# Patient Record
Sex: Male | Born: 1975 | Race: White | Hispanic: No | Marital: Married | Smoking: Never smoker
Health system: Southern US, Community
[De-identification: ages and names within clinical notes are randomized; demographics above are authoritative.]

## PROBLEM LIST (undated history)

## (undated) DIAGNOSIS — J449 Chronic obstructive pulmonary disease, unspecified: Secondary | ICD-10-CM

## (undated) HISTORY — PX: TONSILLECTOMY: SUR1361

---

## 2013-10-05 ENCOUNTER — Emergency Department (HOSPITAL_BASED_OUTPATIENT_CLINIC_OR_DEPARTMENT_OTHER): Payer: Self-pay

## 2013-10-05 ENCOUNTER — Emergency Department (HOSPITAL_BASED_OUTPATIENT_CLINIC_OR_DEPARTMENT_OTHER)
Admission: EM | Admit: 2013-10-05 | Discharge: 2013-10-06 | Disposition: A | Discharge status: Home / Self Care | Payer: Self-pay | Attending: Emergency Medicine | Admitting: Emergency Medicine

## 2013-10-05 ENCOUNTER — Encounter (HOSPITAL_BASED_OUTPATIENT_CLINIC_OR_DEPARTMENT_OTHER): Payer: Self-pay | Admitting: Emergency Medicine

## 2013-10-05 DIAGNOSIS — S2232XA Fracture of one rib, left side, initial encounter for closed fracture: Secondary | ICD-10-CM

## 2013-10-05 DIAGNOSIS — Y9389 Activity, other specified: Secondary | ICD-10-CM | POA: Insufficient documentation

## 2013-10-05 DIAGNOSIS — S2239XA Fracture of one rib, unspecified side, initial encounter for closed fracture: Secondary | ICD-10-CM | POA: Insufficient documentation

## 2013-10-05 DIAGNOSIS — J45901 Unspecified asthma with (acute) exacerbation: Secondary | ICD-10-CM | POA: Insufficient documentation

## 2013-10-05 DIAGNOSIS — F17200 Nicotine dependence, unspecified, uncomplicated: Secondary | ICD-10-CM | POA: Insufficient documentation

## 2013-10-05 DIAGNOSIS — IMO0002 Reserved for concepts with insufficient information to code with codable children: Secondary | ICD-10-CM | POA: Insufficient documentation

## 2013-10-05 DIAGNOSIS — Y92410 Unspecified street and highway as the place of occurrence of the external cause: Secondary | ICD-10-CM | POA: Insufficient documentation

## 2013-10-05 MED ORDER — IBUPROFEN 800 MG PO TABS
800.0000 mg | ORAL_TABLET | Freq: Once | ORAL | Status: AC
Start: 2013-10-05 — End: 2013-10-05
  Administered 2013-10-05: 800 mg via ORAL
  Filled 2013-10-05: qty 1

## 2013-10-05 MED ORDER — ALBUTEROL SULFATE (2.5 MG/3ML) 0.083% IN NEBU
5.0000 mg | INHALATION_SOLUTION | Freq: Once | RESPIRATORY_TRACT | Status: AC
  Administered 2013-10-05: 5 mg via RESPIRATORY_TRACT
  Filled 2013-10-05: qty 6

## 2013-10-05 NOTE — ED Notes (Signed)
C/o left rib and back pain onset 2 weeks ago   Pain increased w cough   Felt increased in pressure w cough tonight
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
## 2013-10-06 MED ORDER — ALBUTEROL SULFATE HFA 108 (90 BASE) MCG/ACT IN AERS: 1.0000 | INHALATION_SPRAY | Freq: Four times a day (QID) | RESPIRATORY_TRACT | Status: AC | PRN

## 2013-10-06 MED ORDER — HYDROCODONE-ACETAMINOPHEN 7.5-325 MG/15ML PO SOLN: 10.0000 mL | Freq: Four times a day (QID) | ORAL | Status: AC | PRN

## 2013-10-06 MED ORDER — GUAIFENESIN ER 600 MG PO TB12: 600.0000 mg | ORAL_TABLET | Freq: Two times a day (BID) | ORAL | Status: AC

## 2013-12-16 ENCOUNTER — Emergency Department (HOSPITAL_BASED_OUTPATIENT_CLINIC_OR_DEPARTMENT_OTHER)
Admission: EM | Admit: 2013-12-16 | Discharge: 2013-12-17 | Discharge status: Against Medical Advice | Payer: Self-pay | Attending: Emergency Medicine | Admitting: Emergency Medicine

## 2013-12-16 ENCOUNTER — Encounter (HOSPITAL_BASED_OUTPATIENT_CLINIC_OR_DEPARTMENT_OTHER): Payer: Self-pay | Admitting: Emergency Medicine

## 2013-12-16 DIAGNOSIS — H9319 Tinnitus, unspecified ear: Secondary | ICD-10-CM | POA: Insufficient documentation

## 2013-12-16 DIAGNOSIS — E119 Type 2 diabetes mellitus without complications: Secondary | ICD-10-CM | POA: Insufficient documentation

## 2013-12-16 DIAGNOSIS — Z79899 Other long term (current) drug therapy: Secondary | ICD-10-CM | POA: Insufficient documentation

## 2013-12-16 DIAGNOSIS — R739 Hyperglycemia, unspecified: Secondary | ICD-10-CM

## 2013-12-16 DIAGNOSIS — Z9089 Acquired absence of other organs: Secondary | ICD-10-CM | POA: Insufficient documentation

## 2013-12-16 DIAGNOSIS — J441 Chronic obstructive pulmonary disease with (acute) exacerbation: Secondary | ICD-10-CM | POA: Insufficient documentation

## 2013-12-16 DIAGNOSIS — R079 Chest pain, unspecified: Secondary | ICD-10-CM | POA: Insufficient documentation

## 2013-12-16 HISTORY — DX: Chronic obstructive pulmonary disease, unspecified: J44.9

## 2013-12-16 LAB — URINALYSIS, ROUTINE W REFLEX MICROSCOPIC
Bilirubin Urine: NEGATIVE
Glucose, UA: >1000 mg/dL — AB
HGB URINE DIPSTICK: NEGATIVE
Ketones, ur: 40 mg/dL — AB
Leukocytes, UA: NEGATIVE
Nitrite: NEGATIVE
PROTEIN: NEGATIVE mg/dL
Specific Gravity, Urine: 1.041 — ABNORMAL HIGH (ref 1.005–1.030)
UROBILINOGEN UA: 0.2 mg/dL (ref 0.0–1.0)
pH: 5 (ref 5.0–8.0)

## 2013-12-16 LAB — CBC
HEMATOCRIT: 46.2 % (ref 39.0–52.0)
Hemoglobin: 16.8 g/dL (ref 13.0–17.0)
MCH: 33.6 pg (ref 26.0–34.0)
MCHC: 36.4 g/dL — ABNORMAL HIGH (ref 30.0–36.0)
MCV: 92.4 fL (ref 78.0–100.0)
PLATELETS: 207 10*3/uL (ref 150–400)
RBC: 5 MIL/uL (ref 4.22–5.81)
RDW: 12.2 % (ref 11.5–15.5)
WBC: 9.7 10*3/uL (ref 4.0–10.5)

## 2013-12-16 LAB — COMPREHENSIVE METABOLIC PANEL
ALT: 17 U/L (ref 0–53)
AST: 8 U/L (ref 0–37)
Albumin: 3.8 g/dL (ref 3.5–5.2)
Alkaline Phosphatase: 130 U/L — ABNORMAL HIGH (ref 39–117)
BILIRUBIN TOTAL: 0.3 mg/dL (ref 0.3–1.2)
BUN: 12 mg/dL (ref 6–23)
CHLORIDE: 92 meq/L — AB (ref 96–112)
CO2: 20 meq/L (ref 19–32)
CREATININE: 0.7 mg/dL (ref 0.50–1.35)
Calcium: 9.4 mg/dL (ref 8.4–10.5)
GFR calc Af Amer: >90 mL/min (ref >90)
GFR calc non Af Amer: >90 mL/min (ref >90)
GLUCOSE: 515 mg/dL — AB (ref 70–99)
Potassium: 4.1 mEq/L (ref 3.7–5.3)
Sodium: 131 mEq/L — ABNORMAL LOW (ref 137–147)
Total Protein: 7.2 g/dL (ref 6.0–8.3)

## 2013-12-16 LAB — CBG MONITORING, ED: Glucose-Capillary: 504 mg/dL — ABNORMAL HIGH (ref 70–99)

## 2013-12-16 LAB — URINE MICROSCOPIC-ADD ON

## 2013-12-16 MED ORDER — SODIUM CHLORIDE 0.9 % IV SOLN
1000.0000 mL | Freq: Once | INTRAVENOUS | Status: AC
  Administered 2013-12-16: 1000 mL via INTRAVENOUS

## 2013-12-16 MED ORDER — SODIUM CHLORIDE 0.9 % IV SOLN: 1000.0000 mL | INTRAVENOUS | Status: DC

## 2013-12-16 NOTE — ED Notes (Signed)
Pt states for the past few days he has been experiencing HA, generalized not feeling well with tired and today the blurred vision was worse so he got worried and had a friend check his blood sugar. Pt states he tried 3 times with the machine and all three times read High seek medical attention. Pt states that this is the first time he has had problems with his blood sugar but he has not been to the doctor in a while to have any checked. Pt states that he is still slighty blurred currently. Pt denies nausea.

## 2013-12-16 NOTE — ED Notes (Signed)
C/o blurred vision, ringing in ears x 2-3 days-tested BS on friend's glucometer over 600 x 3 readings

## 2013-12-16 NOTE — ED Provider Notes (Signed)
CSN: [id number]     Arrival date & time 12/16/13  2038 History  This chart was scribed for Shelda Jakes, MD by Blanchard Kelch, ED Scribe. The patient was seen in room MH06/MH06. Patient's care was started at 11:34 PM.    Chief Complaint  Patient presents with  . Blurred Vision     Patient is a 38 y.o. male presenting with hyperglycemia. The history is provided by the patient. No language interpreter was used.  Hyperglycemia Blood sugar level PTA:  >600 Duration:  4 days Timing:  Constant Progression:  Unchanged Chronicity:  New Diabetes status:  Non-diabetic Current diabetic therapy:  None Ineffective treatments:  None tried Associated symptoms: chest pain, dizziness, increased thirst, nausea, polyuria and shortness of breath   Associated symptoms: no abdominal pain, no confusion, no dysuria, no fever and no vomiting     HPI Comments: Jaime Cohen is a 38 y.o. male who presents to the Emergency Department complaining of an episode of symptomatic hyperglycemia for about four days. Associated symptoms include blurred vision, tinnitus, lightheadedness, dizziness and nausea. He states that he's had polyuria, polydipsia and headaches for about a year but has not checked his BS levels until today. His reading today was greater than 600 on three separate readings but this is the first he has checked his BS since the polyuria and polydipsia began. He denies a prior diagnosis of diabetes. He also reports recent cough and shortness of breath. He was seen for this in the ED and diagnosed with COPD and prescribed an inhaler. He reports intermittent chest pain but denies any currently. He denies fever, abdominal pain, diarrhea, dysuria, leg swelling, rash, history of bleeding easily, neck pain, back pain   Pt denies having a PCP currently.     Past Medical History  Diagnosis Date  . COPD (chronic obstructive pulmonary disease)    Past Surgical History  Procedure Laterality Date  .  Tonsillectomy     No family history on file. History  Substance Use Topics  . Smoking status: Never Smoker   . Smokeless tobacco: Not on file  . Alcohol Use: No    Review of Systems  Constitutional: Negative for fever and chills.  HENT: Positive for tinnitus. Negative for rhinorrhea.   Eyes: Positive for visual disturbance.  Respiratory: Positive for cough and shortness of breath.   Cardiovascular: Positive for chest pain. Negative for leg swelling.  Gastrointestinal: Positive for nausea. Negative for vomiting, abdominal pain and diarrhea.  Endocrine: Positive for polydipsia and polyuria.  Genitourinary: Negative for dysuria.  Musculoskeletal: Negative for back pain.  Skin: Negative for rash.  Neurological: Positive for dizziness, light-headedness and headaches.  Hematological: Does not bruise/bleed easily.  Psychiatric/Behavioral: Negative for confusion.      Allergies  Review of patient's allergies indicates no known allergies.  Home Medications   Current Outpatient Rx  Name  Route  Sig  Dispense  Refill  . albuterol (PROVENTIL HFA;VENTOLIN HFA) 108 (90 BASE) MCG/ACT inhaler   Inhalation   Inhale 1-2 puffs into the lungs every 6 (six) hours as needed for wheezing or shortness of breath.   1 Inhaler   0   . guaiFENesin (MUCINEX) 600 MG 12 hr tablet   Oral   Take 1 tablet (600 mg total) by mouth 2 (two) times daily.   20 tablet   0   . HYDROcodone-acetaminophen (HYCET) 7.5-325 mg/15 ml solution   Oral   Take 10 mLs by mouth every 6 (six) hours as needed  for moderate pain.   60 mL   0   . metFORMIN (GLUCOPHAGE) 500 MG tablet   Oral   Take 1 tablet (500 mg total) by mouth 2 (two) times daily with a meal.   60 tablet   2    Triage Vitals: BP 130/85  Pulse 92  Temp(Src) 98.1 F (36.7 C) (Oral)  Resp 18  Ht 5\' 8"  (1.727 m)  Wt 238 lb (107.956 kg)  BMI 36.20 kg/m2  SpO2 98%  Physical Exam  Nursing note and vitals reviewed. Constitutional: He is  oriented to person, place, and time. He appears well-developed and well-nourished. No distress.  HENT:  Mouth/Throat: Mucous membranes are normal.  Eyes: EOM are normal.  Cardiovascular: Normal rate and regular rhythm.   Pulmonary/Chest: Effort normal and breath sounds normal. No respiratory distress. He has no wheezes. He has no rales.  Abdominal: Bowel sounds are normal. There is no tenderness.  Musculoskeletal: He exhibits no edema.  Neurological: He is alert and oriented to person, place, and time. No cranial nerve deficit.  Skin: Skin is warm and dry.  Psychiatric: He has a normal mood and affect.    ED Course  Procedures (including critical care time)  DIAGNOSTIC STUDIES: Oxygen Saturation is 98% on room air, normal by my interpretation.    COORDINATION OF CARE: 11:34 PM -Will order IV fluids with plan to admit patient if BS remain very elevated. Patient verbalizes understanding and agrees with treatment plan.   Results for orders placed during the hospital encounter of 12/16/13  CBC      Result Value Ref Range   WBC 9.7  4.0 - 10.5 K/uL   RBC 5.00  4.22 - 5.81 MIL/uL   Hemoglobin 16.8  13.0 - 17.0 g/dL   HCT 16.146.2  09.639.0 - 04.552.0 %   MCV 92.4  78.0 - 100.0 fL   MCH 33.6  26.0 - 34.0 pg   MCHC 36.4 (*) 30.0 - 36.0 g/dL   RDW 40.912.2  81.111.5 - 91.415.5 %   Platelets 207  150 - 400 K/uL  COMPREHENSIVE METABOLIC PANEL      Result Value Ref Range   Sodium 131 (*) 137 - 147 mEq/L   Potassium 4.1  3.7 - 5.3 mEq/L   Chloride 92 (*) 96 - 112 mEq/L   CO2 20  19 - 32 mEq/L   Glucose, Bld 515 (*) 70 - 99 mg/dL   BUN 12  6 - 23 mg/dL   Creatinine, Ser 7.820.70  0.50 - 1.35 mg/dL   Calcium 9.4  8.4 - 95.610.5 mg/dL   Total Protein 7.2  6.0 - 8.3 g/dL   Albumin 3.8  3.5 - 5.2 g/dL   AST 8  0 - 37 U/L   ALT 17  0 - 53 U/L   Alkaline Phosphatase 130 (*) 39 - 117 U/L   Total Bilirubin 0.3  0.3 - 1.2 mg/dL   GFR calc non Af Amer >90  >90 mL/min   GFR calc Af Amer >90  >90 mL/min  URINALYSIS,  ROUTINE W REFLEX MICROSCOPIC      Result Value Ref Range   Color, Urine YELLOW  YELLOW   APPearance CLEAR  CLEAR   Specific Gravity, Urine 1.041 (*) 1.005 - 1.030   pH 5.0  5.0 - 8.0   Glucose, UA >1000 (*) NEGATIVE mg/dL   Hgb urine dipstick NEGATIVE  NEGATIVE   Bilirubin Urine NEGATIVE  NEGATIVE   Ketones, ur 40 (*) NEGATIVE mg/dL  Protein, ur NEGATIVE  NEGATIVE mg/dL   Urobilinogen, UA 0.2  0.0 - 1.0 mg/dL   Nitrite NEGATIVE  NEGATIVE   Leukocytes, UA NEGATIVE  NEGATIVE  URINE MICROSCOPIC-ADD ON      Result Value Ref Range   Squamous Epithelial / LPF RARE  RARE   Bacteria, UA RARE  RARE  BASIC METABOLIC PANEL      Result Value Ref Range   Sodium 136 (*) 137 - 147 mEq/L   Potassium 3.5 (*) 3.7 - 5.3 mEq/L   Chloride 99  96 - 112 mEq/L   CO2 17 (*) 19 - 32 mEq/L   Glucose, Bld 265 (*) 70 - 99 mg/dL   BUN 9  6 - 23 mg/dL   Creatinine, Ser 1.61  0.50 - 1.35 mg/dL   Calcium 8.4  8.4 - 09.6 mg/dL   GFR calc non Af Amer >90  >90 mL/min   GFR calc Af Amer >90  >90 mL/min  CBG MONITORING, ED      Result Value Ref Range   Glucose-Capillary 504 (*) 70 - 99 mg/dL   Comment 1 Notify RN     Comment 2 Documented in Chart    CBG MONITORING, ED      Result Value Ref Range   Glucose-Capillary 376 (*) 70 - 99 mg/dL  CBG MONITORING, ED      Result Value Ref Range   Glucose-Capillary 224 (*) 70 - 99 mg/dL     Imaging Review Dg Chest 2 View  12/17/2013   CLINICAL DATA:  Four-day history of hyperglycemia, blurred vision, tinnitus, dizziness, and nausea.  EXAM: CHEST  2 VIEW  COMPARISON:  One view chest x-ray 10/05/2013.  FINDINGS: Cardiomediastinal silhouette unremarkable and unchanged. Lungs clear. Bronchovascular markings normal. Pulmonary vascularity normal. No visible pleural effusions. No pneumothorax. Mild degenerative changes involving the lower thoracic spine.  IMPRESSION: No acute cardiopulmonary disease.   Electronically Signed   By: Hulan Saas M.D.   On: 12/17/2013 00:20    Ct Head Wo Contrast  12/17/2013   CLINICAL DATA:  Hyperglycemia. Blurred vision. Tinnitus. Dizziness. Nausea.  EXAM: CT HEAD WITHOUT CONTRAST  TECHNIQUE: Contiguous axial images were obtained from the base of the skull through the vertex without intravenous contrast.  COMPARISON:  None.  FINDINGS: Ventricular system normal in size and appearance for age. No mass lesion. No midline shift. No acute hemorrhage or hematoma. No extra-axial fluid collections. No evidence of acute infarction. No focal brain parenchymal abnormalities.  No focal osseous abnormalities involving the skull. Opacification of the left sphenoid sinus. Minimal mucosal thickening in the maxillary sinuses. Remaining visualized paranasal sinuses, bilateral mastoid air cells, and bilateral middle ear cavities well-aerated.  IMPRESSION: 1. Normal intracranially. 2. Chronic left sphenoid sinusitis. Minimal chronic sinus disease in the maxillary sinuses.   Electronically Signed   By: Hulan Saas M.D.   On: 12/17/2013 00:32     EKG Interpretation None     CRITICAL CARE Performed by: Shelda Jakes. Total critical care time: 30 Critical care time was exclusive of separately billable procedures and treating other patients. Critical care was necessary to treat or prevent imminent or life-threatening deterioration. Critical care was time spent personally by me on the following activities: development of treatment plan with patient and/or surrogate as well as nursing, discussions with consultants, evaluation of patient's response to treatment, examination of patient, obtaining history from patient or surrogate, ordering and performing treatments and interventions, ordering and review of laboratory studies, ordering and review of  radiographic studies, pulse oximetry and re-evaluation of patient's condition.  MDM   Final diagnoses:  Hyperglycemia  Diabetes   It can patient with history consistent with diabetes for the past several  months. Patient with marked polyuria and polydipsia. Patient blood sugars at home her readings greater than 600. Upon arrival here they were in the upper 400s without any acidosis. Patient given IV insulin 10 units regular insulin. Patient also given fluid bolus and IV fluids. Blood sugar came down into the 300s. Then came down into the mid 200s. Repeat electrolytes showed improvement in sodium and chloride and potassium however patient was starting to show some signs of acidosis with CO2 going down to 17. Patient did not want to be admitted so was attempting to get him under control here and thought perhaps he could be discharged home on Glucophage. Recommended additional fluids and another 5 units of insulin and then a repeat electrolytes however patient did not want to stay consisted of leaving AMA potential risk and concerns explained.  Patient given prescription for Glucophage to start 500 mg twice a day at home. Also given resource guide to help him find a regular doctor. Information on diabetes. Not able to convince the patient to stay. Patient at discharge is mentally alert and able to make decisions on his own.   I personally performed the services described in this documentation, which was scribed in my presence. The recorded information has been reviewed and is accurate.     Shelda Jakes, MD 12/17/13 [phone]

## 2013-12-17 ENCOUNTER — Emergency Department (HOSPITAL_BASED_OUTPATIENT_CLINIC_OR_DEPARTMENT_OTHER): Payer: Self-pay

## 2013-12-17 LAB — BASIC METABOLIC PANEL
BUN: 9 mg/dL (ref 6–23)
CO2: 17 meq/L — AB (ref 19–32)
Calcium: 8.4 mg/dL (ref 8.4–10.5)
Chloride: 99 mEq/L (ref 96–112)
Creatinine, Ser: 0.5 mg/dL (ref 0.50–1.35)
GFR calc Af Amer: >90 mL/min (ref >90)
GFR calc non Af Amer: >90 mL/min (ref >90)
Glucose, Bld: 265 mg/dL — ABNORMAL HIGH (ref 70–99)
Potassium: 3.5 mEq/L — ABNORMAL LOW (ref 3.7–5.3)
SODIUM: 136 meq/L — AB (ref 137–147)

## 2013-12-17 LAB — CBG MONITORING, ED
Glucose-Capillary: 224 mg/dL — ABNORMAL HIGH (ref 70–99)
Glucose-Capillary: 376 mg/dL — ABNORMAL HIGH (ref 70–99)

## 2013-12-17 MED ORDER — INSULIN REGULAR HUMAN 100 UNIT/ML IJ SOLN
10.0000 [IU] | Freq: Once | INTRAMUSCULAR | Status: AC
  Administered 2013-12-17: 10 [IU] via INTRAVENOUS
  Filled 2013-12-17: qty 1

## 2013-12-17 MED ORDER — SODIUM CHLORIDE 0.9 % IV BOLUS (SEPSIS)
1000.0000 mL | Freq: Once | INTRAVENOUS | Status: AC
  Administered 2013-12-17: 1000 mL via INTRAVENOUS

## 2013-12-17 MED ORDER — SODIUM CHLORIDE 0.9 % IV SOLN: INTRAVENOUS | Status: DC

## 2013-12-17 MED ORDER — INSULIN REGULAR HUMAN 100 UNIT/ML IJ SOLN: 5.0000 [IU] | Freq: Once | INTRAMUSCULAR | Status: DC

## 2013-12-17 MED ORDER — METFORMIN HCL 500 MG PO TABS: 500.0000 mg | ORAL_TABLET | Freq: Two times a day (BID) | ORAL | Status: AC

## 2013-12-17 MED ORDER — ONDANSETRON HCL 4 MG/2ML IJ SOLN
4.0000 mg | Freq: Once | INTRAMUSCULAR | Status: DC
Start: 2013-12-17 — End: 2013-12-17
  Filled 2013-12-17: qty 2

## 2013-12-17 NOTE — Discharge Instructions (Signed)
Complementary and Alternative Medical Therapies for Diabetes Complementary and alternative medicines are health care practices or products that are not always accepted as part of routine medicine. Complementary medicine is used along with routine medicine (medical therapy). Alternative medicine can sometimes be used instead of routine medicine. Some people use these methods to treat diabetes. While some of these therapies may be effective, others may not be. Some may even be harmful. Patients using these methods need to tell their caregiver. It is important to let your caregivers know what you are doing. Some of these therapies are discussed below. For more information, talk with your caregiver. THERAPIES Acupuncture Acupuncture is done by a professional who inserts needles into certain points on the skin. Some scientists believe that this triggers the release of the body's natural painkillers. It has been shown to relieve long-term (chronic) pain. This may help patients with painful nerve damage caused by diabetes. Biofeedback Biofeedback helps a person become more aware of the body's response to pain. It also helps you learn to deal with the pain. This alternative therapy focuses on relaxation and stress-reduction techniques. Thinking of peaceful mental images (guided imagery) is one technique. Some people believe these images can ease their condition. MEDICATIONS Chromium Several studies report that chromium supplements may improve diabetes control. Chromium helps insulin improve its action. Research is not yet certain. Supplements have not been recommended or approved. Caution is needed if you have kidney (renal) problems. Ginseng There are several types of ginseng plants. American ginseng is used for diabetes studies. Those studies have shown some glucose-lowering effects. Those effects have been seen with fasting and after-meal blood glucose levels. They have also been seen in A1c levels (average  blood glucose levels over a 49-month period). More long-term studies are needed before recommendations for use of ginseng can be made. Magnesium Experts have studied the relationship between magnesium and diabetes for many years. But it is not yet fully understood. Studies suggest that a low amount of magnesium may make blood glucose control worse in type 2 diabetes. Research also shows that a low amount may contribute to certain diabetes complications. One study showed that people who consume more magnesium had less risk of type 2 diabetes. Eating whole grains, nuts, and green leafy vegetables raises the magnesium level. Vanadium Vanadium is a compound found in tiny amounts in plants and animals. Early studies showed that vanadium improved blood glucose levels in animals with type 1 and type 2 diabetes. One study found that when given vanadium, those with diabetes were able to decrease their insulin dosage. Researchers still need to learn how it works in the body to discover any side effects, and to find safe dosages. Cinnamon There have been a couple of studies that seem to indicate cinnamon decreases insulin resistance and increases insulin production. By doing so, it may lower blood glucose. Exact doses are unknown, but it may work best when used in combination with other diabetes medicines. Document Released: 07/13/2007 Document Revised: 12/08/2011 Document Reviewed: 07/26/2009 [facility] Patient Information 2014 [place], [place].  Diabetes and Exercise Exercising regularly is important. It is not just about losing weight. It has many health benefits, such as:  Improving your overall fitness, flexibility, and endurance.  Increasing your bone density.  Helping with weight control.  Decreasing your body fat.  Increasing your muscle strength.  Reducing stress and tension.  Improving your overall health. People with diabetes who exercise gain additional benefits because exercise:  Reduces  appetite.  Improves the body's use  of blood sugar (glucose).  Helps lower or control blood glucose.  Decreases blood pressure.  Helps control blood lipids (such as cholesterol and triglycerides).  Improves the body's use of the hormone insulin by:  Increasing the body's insulin sensitivity.  Reducing the body's insulin needs.  Decreases the risk for heart disease because exercising:  Lowers cholesterol and triglycerides levels.  Increases the levels of good cholesterol (such as high-density lipoproteins [HDL]) in the body.  Lowers blood glucose levels. YOUR ACTIVITY PLAN  Choose an activity that you enjoy and set realistic goals. Your health care provider or diabetes educator can help you make an activity plan that works for you. You can break activities into 2 or 3 sessions throughout the day. Doing so is as good as one long session. Exercise ideas include:  Taking the dog for a walk.  Taking the stairs instead of the elevator.  Dancing to your favorite song.  Doing your favorite exercise with a friend. RECOMMENDATIONS FOR EXERCISING WITH TYPE 1 OR TYPE 2 DIABETES   Check your blood glucose before exercising. If blood glucose levels are greater than 240 mg/dL, check for urine ketones. Do not exercise if ketones are present.  Avoid injecting insulin into areas of the body that are going to be exercised. For example, avoid injecting insulin into:  The arms when playing tennis.  The legs when jogging.  Keep a record of:  Food intake before and after you exercise.  Expected peak times of insulin action.  Blood glucose levels before and after you exercise.  The type and amount of exercise you have done.  Review your records with your health care provider. Your health care provider will help you to develop guidelines for adjusting food intake and insulin amounts before and after exercising.  If you take insulin or oral hypoglycemic agents, watch for signs and  symptoms of hypoglycemia. They include:  Dizziness.  Shaking.  Sweating.  Chills.  Confusion.  Drink plenty of water while you exercise to prevent dehydration or heat stroke. Body water is lost during exercise and must be replaced.  Talk to your health care provider before starting an exercise program to make sure it is safe for you. Remember, almost any type of activity is better than none. Document Released: 12/06/2003 Document Revised: 05/18/2013 Document Reviewed: 02/22/2013 [facility] Patient Information 2014 [place], [place].  Glucose, Blood Sugar, Fasting Blood Sugar This is a test to measure your blood sugar. Glucose is a simple sugar that serves as the main source of energy for the body. The carbohydrates we eat are broken down into glucose (and a few other simple sugars), absorbed by the small intestine, and circulated throughout the body. Most of the body's cells require glucose for energy production; brain and nervous system cells not only rely on glucose for energy, they can only function when glucose levels in the blood remain above a certain level.  The body's use of glucose hinges on the availability of insulin, a hormone produced by the pancreas. Insulin acts as a Engineer, maintenance, transporting glucose into the body's cells, directing the body to store excess glucose as glycogen (for short-term storage) and/or as triglycerides in fat cells. We can not live without glucose or insulin, and they must be in balance.  Normally, blood glucose levels rise slightly after a meal, and insulin is secreted to lower them, with the amount of insulin released matched up with the size and content of the meal. If blood glucose levels drop too low,  such as might occur in between meals or after a strenuous workout, glucagon (another pancreatic hormone) is secreted to tell the liver to turn some glycogen back into glucose, raising the blood glucose levels. If the glucose/insulin feedback mechanism is  working properly, the amount of glucose in the blood remains fairly stable. If the balance is disrupted and glucose levels in the blood rise, then the body tries to restore the balance, both by increasing insulin production and by excreting glucose in the urine.  PREPARATION FOR TEST A blood sample drawn from a vein in your arm or, for a self check, a drop of blood from a skin prick; in general, it may be recommended that you fast before having a blood glucose test; sometimes a random (no preparation) urine sample is used. Your caregiver will instruct you as to what they want prior to your testing. NORMAL FINDINGS Normal values depend on many factors. Your lab will provide a range of normal values with your test results. The following information summarizes the meaning of the test results. These are based on the clinical practice recommendations of the American Diabetes Association.  FASTING BLOOD GLUCOSE  From 70 to 99 mg/dL (3.9 to 5.5 mmol/L): Normal glucose tolerance  From 100 to 125 mg/dL (5.6 to 6.9 mmol/L):Impaired fasting glucose (pre-diabetes)  126 mg/dL (7.0 mmol/L) and above on more than one testing occasion: Diabetes ORAL GLUCOSE TOLERANCE TEST (OGTT) [EXCEPT PREGNANCY] (2 HOURS AFTER A 75-GRAM GLUCOSE DRINK)  Less than 140 mg/dL (7.8 mmol/L): Normal glucose tolerance  From 140 to 200 mg/dL (7.8 to 96.2 mmol/L): Impaired glucose tolerance (pre-diabetes)  Over 200 mg/dL (95.2 mmol/L) on more than one testing occasion: Diabetes GESTATIONAL DIABETES SCREENING: GLUCOSE CHALLENGE TEST (1 HOUR AFTER A 50-GRAM GLUCOSE DRINK)  Less than 140* mg/dL (7.8 mmol/L): Normal glucose tolerance  140* mg/dL (7.8 mmol/L) and over: Abnormal, needs OGTT (see below) * Some use a cutoff of More Than 130 mg/dL (7.2 mmol/L) because that identifies 90% of women with gestational diabetes, compared to 80% identified using the threshold of More Than 140 mg/dL (7.8 mmol/L). GESTATIONAL DIABETES DIAGNOSTIC:  OGTT (100-GRAM GLUCOSE DRINK)  Fasting*..........................................95 mg/dL (5.3 mmol/L)  1 hour after glucose load*..............180 mg/dL (84.1 mmol/L)  2 hours after glucose load*.............155 mg/dL (8.6 mmol/L)  3 hours after glucose load* **.........140 mg/dL (7.8 mmol/L) * If two or more values are above the criteria, gestational diabetes is diagnosed. ** A 75-gram glucose load may be used, although this method is not as well validated as the 100-gram OGTT; the 3-hour sample is not drawn if 75 grams is used.  Ranges for normal findings may vary among different laboratories and hospitals. You should always check with your doctor after having lab work or other tests done to discuss the meaning of your test results and whether your values are considered within normal limits. MEANING OF TEST  Your caregiver will go over the test results with you and discuss the importance and meaning of your results, as well as treatment options and the need for additional tests if necessary. OBTAINING THE TEST RESULTS It is your responsibility to obtain your test results. Ask the lab or department performing the test when and how you will get your results. Document Released: 10/17/2004 Document Revised: 12/08/2011 Document Reviewed: 08/26/2008 [facility] Patient Information 2014 [place], [place].   Emergency Department Resource Guide 1) Find a Doctor and Pay Out of Pocket Although you won't have to find out who is covered by your insurance plan, it is  a good idea to ask around and get recommendations. You will then need to call the office and see if the doctor you have chosen will accept you as a new patient and what types of options they offer for patients who are self-pay. Some doctors offer discounts or will set up payment plans for their patients who do not have insurance, but you will need to ask so you aren't surprised when you get to your appointment.  2) Contact Your Local Health  Department Not all health departments have doctors that can see patients for sick visits, but many do, so it is worth a call to see if yours does. If you don't know where your local health department is, you can check in your phone book. The CDC also has a tool to help you locate your state's health department, and many state websites also have listings of all of their local health departments.  3) Find a Walk-in Clinic If your illness is not likely to be very severe or complicated, you may want to try a walk in clinic. These are popping up all over the country in pharmacies, drugstores, and shopping centers. They're usually staffed by nurse practitioners or physician assistants that have been trained to treat common illnesses and complaints. They're usually fairly quick and inexpensive. However, if you have serious medical issues or chronic medical problems, these are probably not your best option.  No Primary Care Doctor: - Call Health Connect at  [phone] - they can help you locate a primary care doctor that  accepts your insurance, provides certain services, etc. - Physician Referral Service- [phone]  Chronic Pain Problems: Organization         Address  Phone   Notes  [facility]  [phone] Patients need to be referred by their primary care doctor.   Medication Assistance: Organization         Address  Phone   Notes  [facility] [address] [place], [place] [address] [phone] --Must be a resident of [facility] -- Must have NO insurance coverage whatsoever (no Medicaid/ Medicare, etc.) -- The pt. MUST have a primary care doctor that directs their care regularly and follows them in the community   [facility]  [phone]   [employer]  [phone]    Agencies that provide inexpensive medical care: Organization         Address  Phone   Notes  [facility]  [phone]   [facility]    [phone]   [facility] [address] [place], [place] [address] [phone]   [facility] [address], [place] [phone]   [facility]    [phone]   [facility]    [phone]   [facility]  [address], [place] Phone:  [phone], Fax:  [phone] Hours of Operation:  9 am - 6 pm, M-F.  Also accepts Medicaid/Medicare and self-pay.  [facility]  [address], [place] Phone: [phone], Fax: [phone]. Hours of Operation:  8:30 am - 5:30 pm, M-F.  Also accepts Medicaid and self-pay.  [facility] [place] [address], [place] Phone: [phone]   [facility] [address], [place], [place] [phone], Ext. [phone] Mondays & Thursdays: 7-9 AM.  patients are seen on a first come, first serve basis. °  ° °Medicaid-accepting [place] Providers: ° °Organization         Address  Phone   Notes  °[facility] [address], [place] [phone] Also accepts self-pay patients.  °[facility] [address], [place] ° [phone]   °[facility] [address], [place] [phone]   °[facility] [address] [place] [address], [place] [phone]   °Veita Bland [address], [place]  ° [phone] Only accepts [place] Access Medicaid patients after they have their name applied to their card.  ° °Self-Pay (no insurance) in [place]: ° °Organization         Address  Phone   Notes  °Sickle Cell Patients, [facility] [address], [place] [phone]   °[facility] [address], [place] [phone]   °[facility] ° [address], [place] [phone]     °[facility]/Dr. Osei-Bonsu ° [address] [place] [address], [place] or [address], [place] [phone] Phone number for both [place] and [place] locations is the same.  °[facility] [address], [place] [phone]   °[facility] [address] [place] [address], [place] or [address] [phone] °[phone]   °[facility] [address], [place] [phone], phone; [phone], fax Sees patients 1st and 3rd Saturday of every month.  Must not qualify for public or private insurance (i.e. Medicaid, Medicare, [place] Health Choice, Veterans' Benefits) • Household income should be no more than 200% of the poverty level •The clinic cannot treat you if you are pregnant or think you are pregnant • Sexually transmitted diseases are not treated at the clinic.  ° ° °Dental Care: °Organization         Address  Phone  Notes  °[place] [facility] [address],  [phone] Accepts children up to age 21 who are enrolled in Medicaid or [place] Health Choice; pregnant women with a Medicaid card; and children who have applied for Medicaid or [place] Health Choice, but were declined, whose parents can pay a reduced fee at time of service.  °[place] [facility] [place]  [address], [place] [phone] Accepts children up to age 21 who are enrolled in Medicaid or [place] Health Choice; pregnant women with a Medicaid card; and children who have applied for Medicaid or  Health Choice, but were declined, whose parents can pay a reduced fee at time of service.  °[facility] ° [address],  [phone] Patients are seen by appointment only. Walk-ins are not accepted. [facility] will see patients 18 years of age and older. °Monday - Tuesday (8am-5pm) °Most Wednesdays (8:30-5pm) °$30 per visit,  cash only  °[facility] ° [address], [place] [phone] Patients are seen by appointment only. Walk-ins are not accepted. [facility] will see patients 18 years of age and older. °One Wednesday Evening (Monthly: Volunteer Based).  $30 per visit, cash only  °[facility]  [phone] for adults; Children under age 4, call Graduate Pediatric   Pediatric Dentistry at [phone]. Children aged 84-14, please call [phone] to request a pediatric application.  Dental services are provided in all areas of dental care including fillings, crowns and bridges, complete and partial dentures, implants, gum treatment, root canals, and extractions. Preventive care is also provided. Treatment is provided to both adults and children. Patients are selected via a lottery and there is often a waiting list.   [facility] [address], [place]  [phone] [email or web address]   [facility] [address], [place], [place] [phone], Ext. [phone] Second and Fourth Thursday of each month, opens at 6:30 AM; Clinic ends at 9 AM.  Patients are seen on a first-come first-served basis, and a limited number are seen during each clinic.   [facility]  [address], [place], [place] [phone]   Eligibility Requirements You must have lived in [place], or [place] for at least the last three months.   You cannot be eligible for state or federal sponsored [place] Cityhealthcare insurance, including CIG[employer]eterans Administration, [place], or [employer]icare.   You generally cannot be eligible for healthcare insurance through your employer.    How to apply: Eligibility screenings are held every Tuesday and Wednesday afternoon from 1:00 pm until 4:00 pm. You do not need an appointment for the interview!  [facility] [address], [place], [place] [phone]   [facility]  [phone]   [facility]  [phone]   [facility]  [phone]    Behavioral Health Resources in the Community: Intensive Outpatient Programs Organization         Address  Phone  Notes  [facility] [address], [place], [place] [phone]   [facility] [address], [place], [place] [phone]   [facility] [address], [place], [place]  [phone]   [facility] [address],  [place], [place] [phone] or [phone]   Substance Abuse Resources Organization         Address  Phone  Notes  [facility]  [phone]   [facility]  [phone]   [facility]  [phone]   [facility]  [phone]   Residential & Outpatient Substance Abuse Program  [phone]   Psychological Services Organization         Address  Phone  Notes  [facility]  [phone]- [phone]   [facility]  [phone]   [facility] [address], [place] [phone] or [phone]    Mobile Crisis Teams Organization         Address  Phone  Notes  [facility], Mobile Crisis Care Unit  [phone]   [facility]  [address] [place], [place] [phone]   Doristine LocksSharon DeEsch [address] [place] [address] [place] [phone]    Self-Help/Support Groups Organization         Address  Phone             Notes  [facility] [place] - variety of support groups  [phone]- [phone] Call for more information  Narcotics Anonymous (NA), [facility] [address], [place]  2 meetings at this location   [facility] Treatment Programs Organization         Address  Phone  Notes  [facility] [address],  [place]  [phone]   [facility]  [address] [place] [address], [place], [place]  [phone]   [facility] [address] [place], [place] [phone] Admissions: 8am-3pm M-F  [facility] [address]    [place], [place] [phone]   [facility] [address] [place], [place], [place] [phone]   [facility] [address],  [place], [place] [phone]   [facility] - Intensive Outpatient [address], [place], [place] [phone]   [facility] [address] [place]  [place] [place], [place] [phone] or [phone]   [facility] [address], [place], [place] [phone] Accepts Medicaid  [facility] [address],  [place] [phone] Substance Abuse/Addiction Treatment   [facility]         Address  Phone  Notes  [facility]  [phone]   Angie Fava, PhD [address] [place], [place]   [phone] or [phone]   [facility]   [address] [place], [place] [phone]   [facility] [address] [place], [place] [phone] Insurance/Medicaid/sponsorship through [facility] [address]                                    [place], [place] [phone] Therapy/tele-psych/case  [facility] [address], [place] [phone]    Dr. Lolly Mustache  [phone]   [facility]  [employer] [facility] Dept. 1) [address], [place] 2) [address], [place] 3)  [address], [place] [phone]  [phone]   [facility] Child Abuse Hotline 571-653-8050 or [phone] (After Hours)      Take hypoglycemic as directed. Resource guide provided to help you find a regular Dr. If you change your mind were glad to see her at any time.

## 2013-12-17 NOTE — ED Notes (Signed)
Pt upset, his significant other is at the bedside arguing with him requesting that he stay in the ED - pt requesting to sign out AMA - states that he has a kid at home he has to take care of = pt informed of risks/benefits of leaving AMA - EDP at bedside = explained to pt importance of monitoring glucose levels, following up with [facility] or a primary care provider, as well as knowing s/s of hyper/hypoglycemia and to return to ED if unable to obtain follow up care or any worsening concerns/symptoms. States understanding.

## 2013-12-17 NOTE — ED Notes (Signed)
Pt states that he is not nauseated. Pt aware of zofran if needed but does not require it now.

## 2014-11-01 IMAGING — CR DG CHEST 2V
2 series · 2 of 2 positions shown · non-contrast
Comparison: One view chest x-ray 10/05/2013.

CLINICAL DATA: Four-day history of hyperglycemia, blurred vision,
tinnitus, dizziness, and nausea.

EXAM:
CHEST  2 VIEW

[w chest pa]
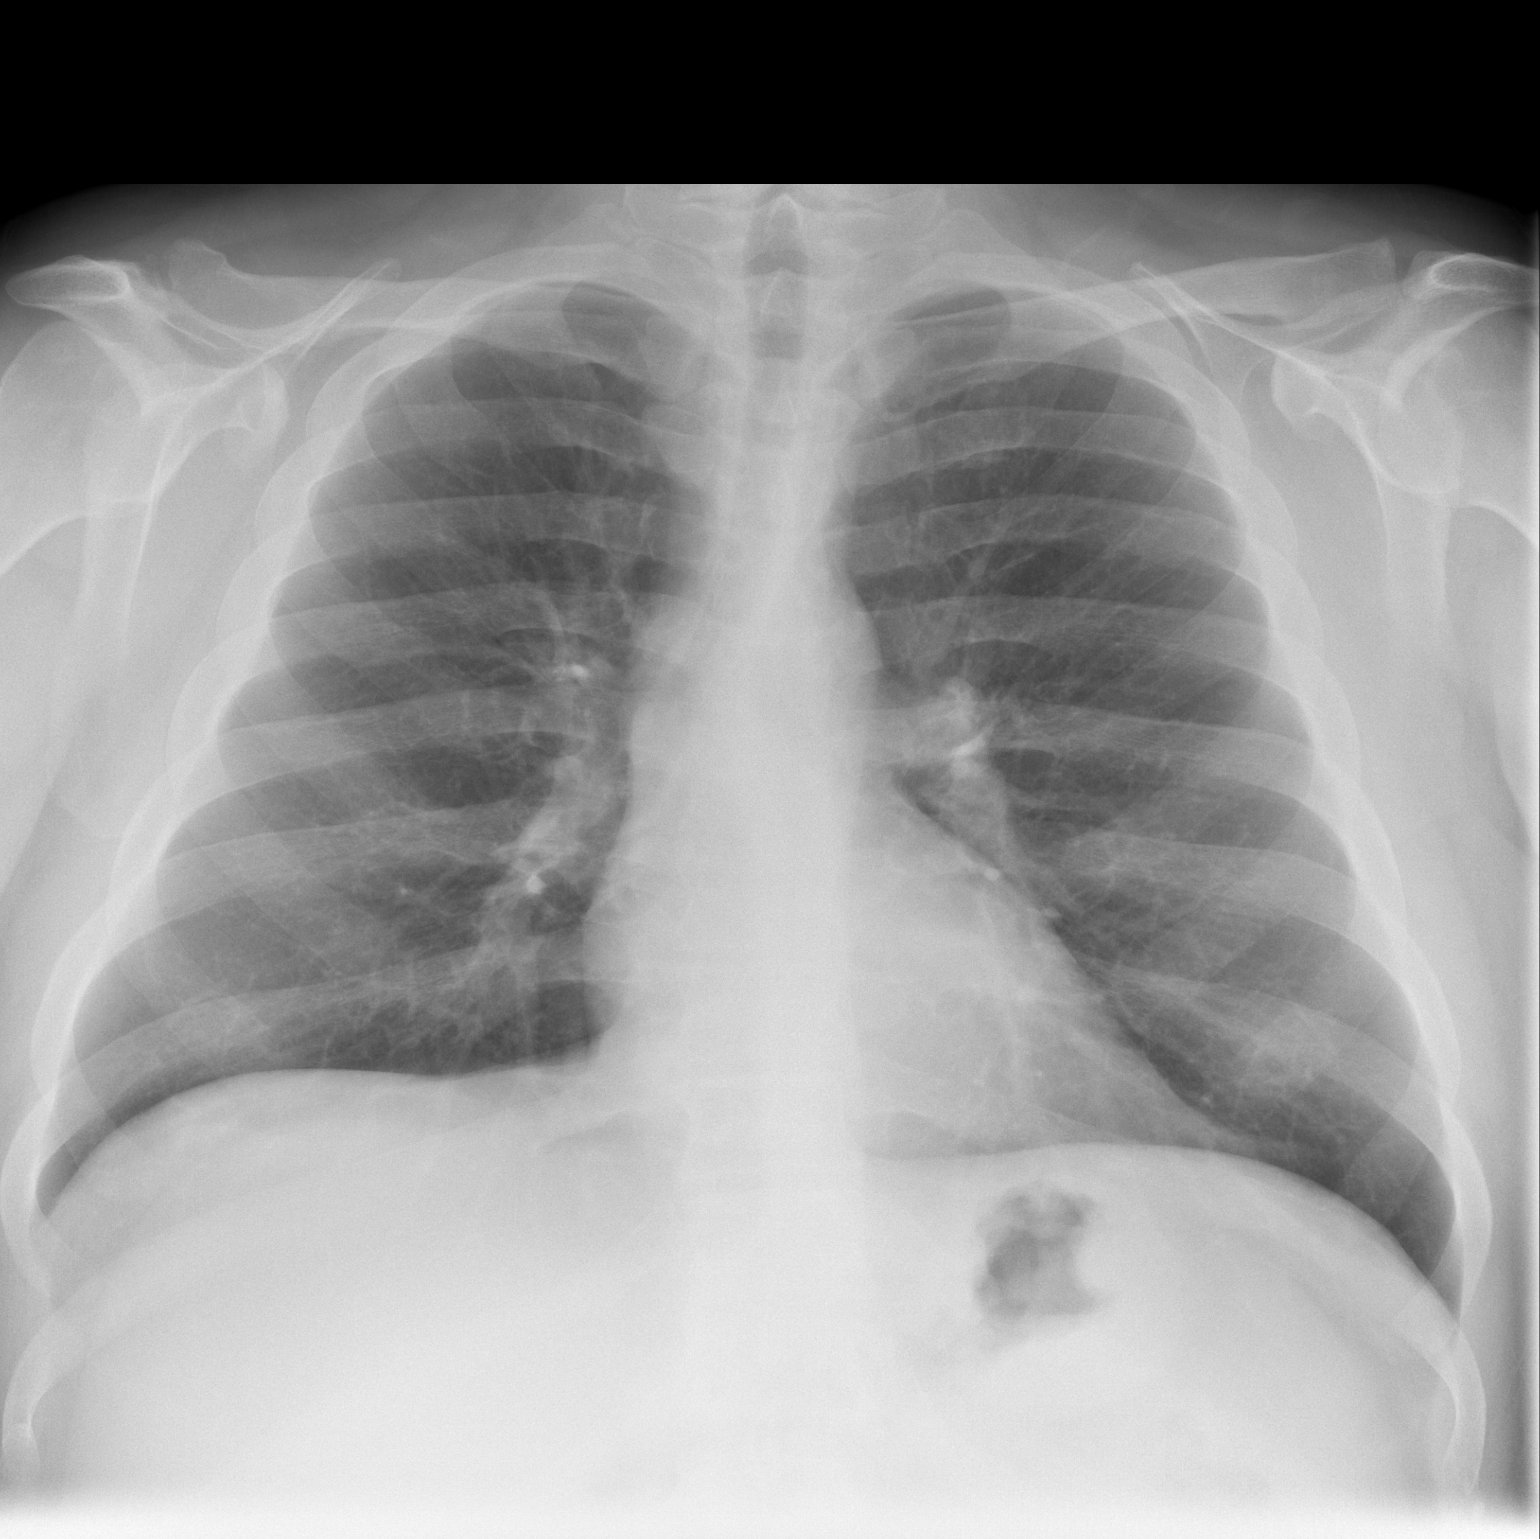

[w chest lat]
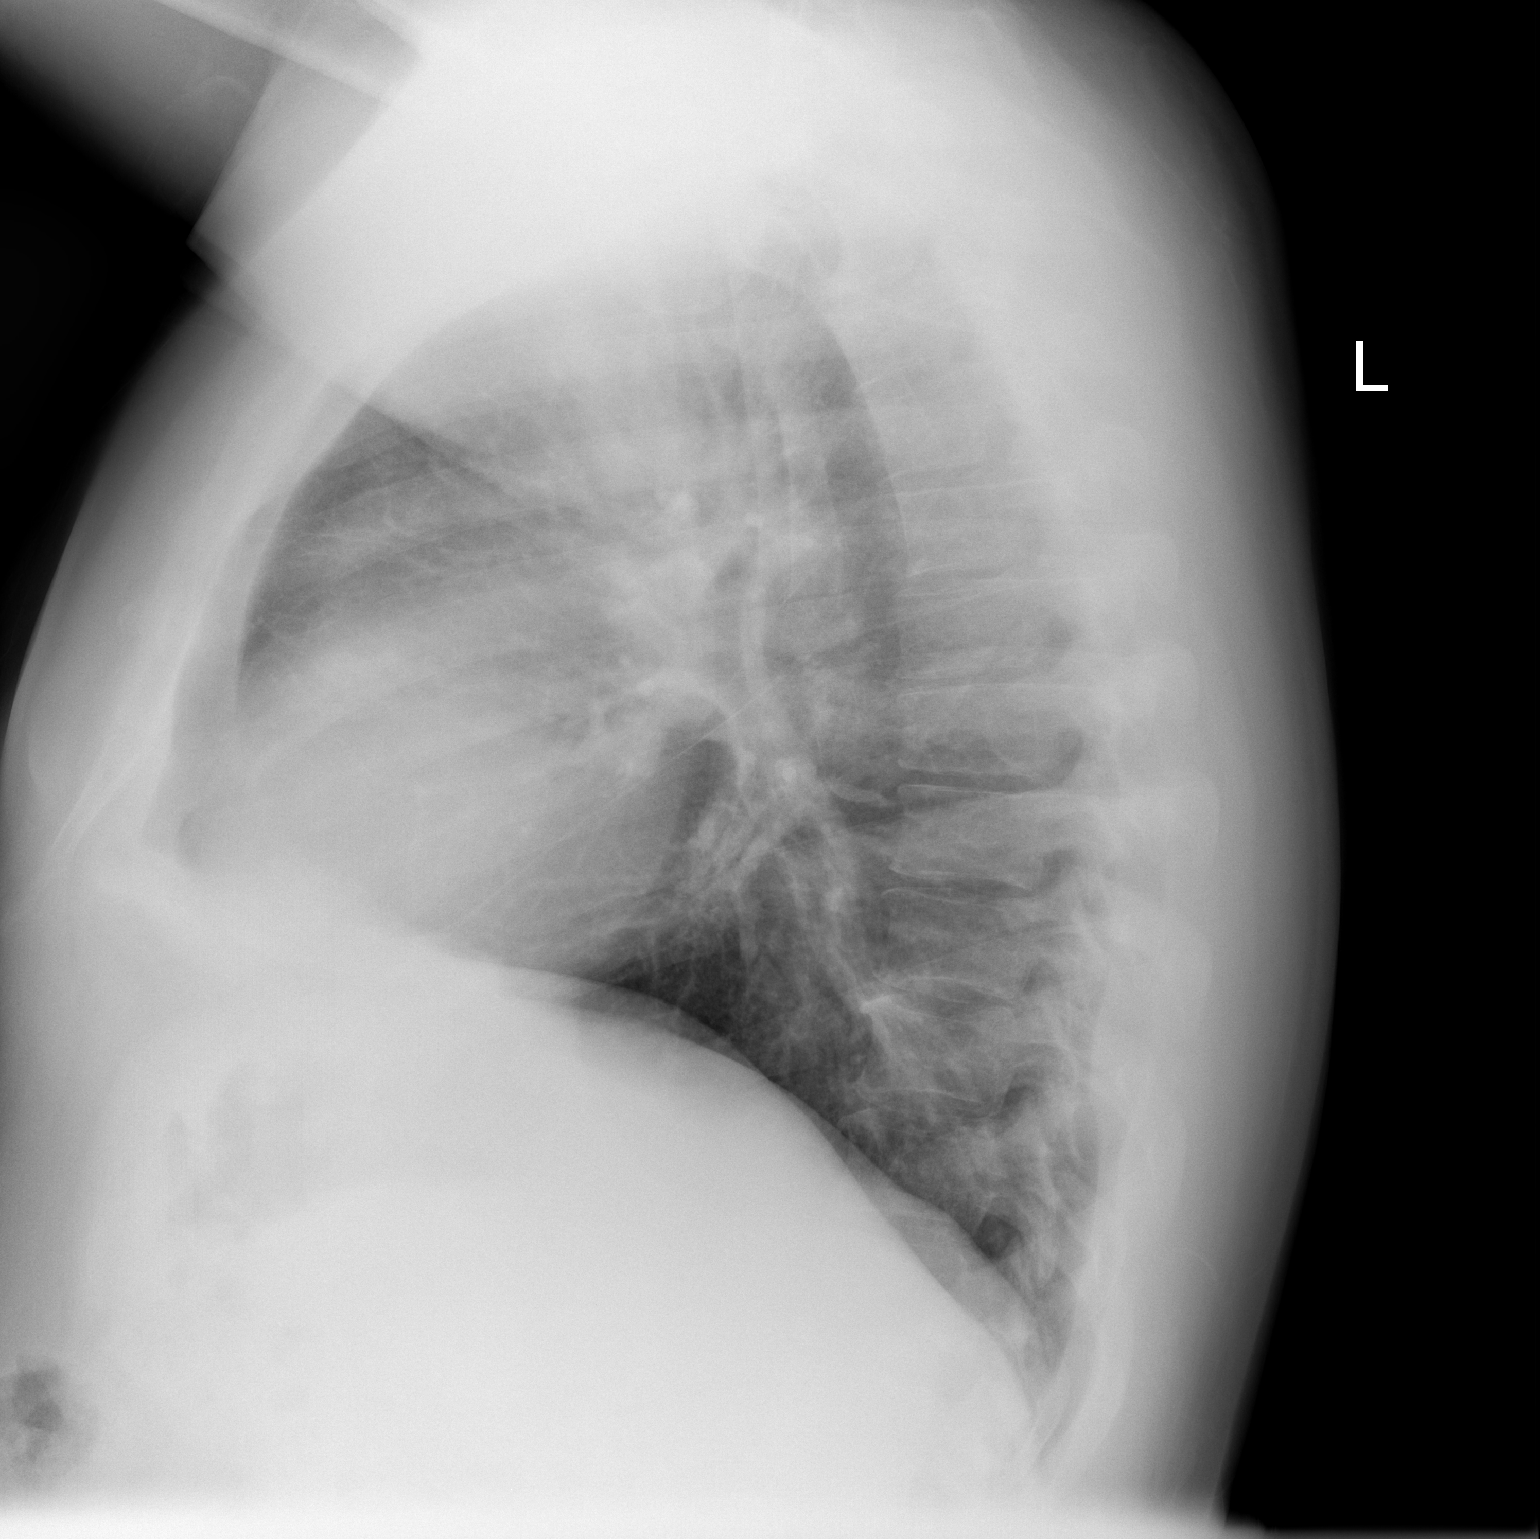

[2 of 2 positions shown; findings below may reference images not displayed]

FINDINGS: Cardiomediastinal silhouette unremarkable and unchanged. Lungs
clear. Bronchovascular markings normal. Pulmonary vascularity
normal. No visible pleural effusions. No pneumothorax. Mild
degenerative changes involving the lower thoracic spine.
IMPRESSION: No acute cardiopulmonary disease.
